# Patient Record
Sex: Female | Born: 1965 | Race: Black or African American | Hispanic: No | Marital: Single | Smoking: Never smoker
Health system: Southern US, Community
[De-identification: ages and names within clinical notes are randomized; demographics above are authoritative.]

## PROBLEM LIST (undated history)

## (undated) DIAGNOSIS — E079 Disorder of thyroid, unspecified: Secondary | ICD-10-CM

## (undated) DIAGNOSIS — E0500 Thyrotoxicosis with diffuse goiter without thyrotoxic crisis or storm: Secondary | ICD-10-CM

## (undated) DIAGNOSIS — H5789 Other specified disorders of eye and adnexa: Secondary | ICD-10-CM

## (undated) HISTORY — PX: ABDOMINAL HYSTERECTOMY: SHX81

## (undated) HISTORY — PX: COLONOSCOPY W/ BIOPSIES AND POLYPECTOMY: SHX1376

---

## 2019-11-02 DIAGNOSIS — E039 Hypothyroidism, unspecified: Secondary | ICD-10-CM | POA: Diagnosis not present

## 2019-11-02 DIAGNOSIS — Z20828 Contact with and (suspected) exposure to other viral communicable diseases: Secondary | ICD-10-CM | POA: Diagnosis not present

## 2019-11-02 DIAGNOSIS — R197 Diarrhea, unspecified: Secondary | ICD-10-CM | POA: Diagnosis not present

## 2019-11-02 DIAGNOSIS — E785 Hyperlipidemia, unspecified: Secondary | ICD-10-CM | POA: Diagnosis not present

## 2019-11-27 DIAGNOSIS — R197 Diarrhea, unspecified: Secondary | ICD-10-CM | POA: Diagnosis not present

## 2019-11-27 DIAGNOSIS — E039 Hypothyroidism, unspecified: Secondary | ICD-10-CM | POA: Diagnosis not present

## 2019-11-27 DIAGNOSIS — Z6829 Body mass index (BMI) 29.0-29.9, adult: Secondary | ICD-10-CM | POA: Diagnosis not present

## 2019-11-27 DIAGNOSIS — F4323 Adjustment disorder with mixed anxiety and depressed mood: Secondary | ICD-10-CM | POA: Diagnosis not present

## 2019-12-11 DIAGNOSIS — Z6831 Body mass index (BMI) 31.0-31.9, adult: Secondary | ICD-10-CM | POA: Diagnosis not present

## 2019-12-11 DIAGNOSIS — F4323 Adjustment disorder with mixed anxiety and depressed mood: Secondary | ICD-10-CM | POA: Diagnosis not present

## 2019-12-11 DIAGNOSIS — E039 Hypothyroidism, unspecified: Secondary | ICD-10-CM | POA: Diagnosis not present

## 2020-05-20 DIAGNOSIS — E0500 Thyrotoxicosis with diffuse goiter without thyrotoxic crisis or storm: Secondary | ICD-10-CM | POA: Diagnosis not present

## 2020-05-20 DIAGNOSIS — E669 Obesity, unspecified: Secondary | ICD-10-CM | POA: Diagnosis not present

## 2020-05-20 DIAGNOSIS — E890 Postprocedural hypothyroidism: Secondary | ICD-10-CM | POA: Diagnosis not present

## 2020-07-20 DIAGNOSIS — E890 Postprocedural hypothyroidism: Secondary | ICD-10-CM | POA: Diagnosis not present

## 2020-08-04 ENCOUNTER — Other Ambulatory Visit: Payer: Self-pay | Admitting: Obstetrics and Gynecology

## 2020-08-04 DIAGNOSIS — Z1231 Encounter for screening mammogram for malignant neoplasm of breast: Secondary | ICD-10-CM

## 2020-08-04 DIAGNOSIS — N644 Mastodynia: Secondary | ICD-10-CM | POA: Diagnosis not present

## 2020-09-10 DIAGNOSIS — E890 Postprocedural hypothyroidism: Secondary | ICD-10-CM | POA: Diagnosis not present

## 2020-09-15 ENCOUNTER — Other Ambulatory Visit: Payer: Self-pay

## 2020-09-15 ENCOUNTER — Ambulatory Visit
Admission: EM | Admit: 2020-09-15 | Discharge: 2020-09-15 | Disposition: A | Discharge status: Home / Self Care | Payer: Self-pay | Attending: Internal Medicine | Admitting: Internal Medicine

## 2020-09-15 ENCOUNTER — Encounter: Payer: Self-pay | Admitting: Emergency Medicine

## 2020-09-15 DIAGNOSIS — R6889 Other general symptoms and signs: Secondary | ICD-10-CM

## 2020-09-15 HISTORY — DX: Disorder of thyroid, unspecified: E07.9

## 2020-09-15 MED ORDER — IBUPROFEN 600 MG PO TABS: 600.0000 mg | ORAL_TABLET | Freq: Four times a day (QID) | ORAL | 0 refills | Status: AC | PRN

## 2020-09-15 MED ORDER — BENZONATATE 100 MG PO CAPS: 100.0000 mg | ORAL_CAPSULE | Freq: Three times a day (TID) | ORAL | 0 refills | Status: AC

## 2020-09-15 NOTE — ED Triage Notes (Signed)
Pt sts woke up this am with no taste and some diarrhea and body aches; pt sts has had recent covid exposure

## 2020-09-15 NOTE — Discharge Instructions (Addendum)
Per CDC recommendations there is no need to quarantine because you have had full COVID-vaccine series with the booster Wear mask when around people We will call you with lab results when available Take medications as directed. By biotin over-the-counter to help with oral dryness

## 2020-09-16 ENCOUNTER — Other Ambulatory Visit: Payer: Self-pay

## 2020-09-17 NOTE — ED Provider Notes (Signed)
[facility]    CSN: [id number] Arrival date & time: 09/15/20  1821      History   Chief Complaint Chief Complaint  Patient presents with  . Covid Exposure    HPI Martha Dixon is a 55 y.o. female to the urgent care with complaints of loss of taste, generalized body aches and diarrhea at this morning.  Patient had COVID-19 exposure few days ago.  No fever or chills.  Patient also complains of nonproductive cough.  No shortness of breath or wheezing.  Oral intake is preserved.  Bowel movements are loose with no blood or mucus.  No abdominal pain.   HPI  Past Medical History:  Diagnosis Date  . Thyroid disease     There are no problems to display for this patient.   History reviewed. No pertinent surgical history.  OB History   No obstetric history on file.      Home Medications    Prior to Admission medications   Medication Sig Start Date End Date Taking? Authorizing Provider  benzonatate (TESSALON) 100 MG capsule Take 1 capsule (100 mg total) by mouth every 8 (eight) hours. 09/15/20  Yes Leigh Kaeding, Britta Mccreedy, MD  ibuprofen (ADVIL) 600 MG tablet Take 1 tablet (600 mg total) by mouth every 6 (six) hours as needed. 09/15/20  Yes Iyonnah Ferrante, Britta Mccreedy, MD    Family History History reviewed. No pertinent family history.  Social History Social History   Tobacco Use  . Smoking status: Never Smoker  . Smokeless tobacco: Never Used  Substance Use Topics  . Alcohol use: Not Currently  . Drug use: Never     Allergies   Tetracyclines & related   Review of Systems Review of Systems  Constitutional: Negative for chills, fatigue and fever.  HENT: Negative for congestion and sore throat.   Respiratory: Positive for cough.   Gastrointestinal: Positive for diarrhea. Negative for abdominal pain, nausea and vomiting.  Genitourinary: Negative.   Musculoskeletal: Positive for myalgias.  Neurological: Negative.  Negative for headaches.     Physical  Exam Triage Vital Signs ED Triage Vitals  Enc Vitals Group     BP 09/15/20 2006 129/66     Pulse Rate 09/15/20 2006 (!) 57     Resp 09/15/20 2006 18     Temp 09/15/20 2006 98.2 F (36.8 C)     Temp src --      SpO2 09/15/20 2006 98 %     Weight --      Height --      Head Circumference --      Peak Flow --      Pain Score 09/15/20 2011 3     Pain Loc --      Pain Edu? --      Excl. in GC? --    No data found.  Updated Vital Signs BP 129/66   Pulse (!) 57   Temp 98.2 F (36.8 C)   Resp 18   SpO2 98%   Visual Acuity Right Eye Distance:   Left Eye Distance:   Bilateral Distance:    Right Eye Near:   Left Eye Near:    Bilateral Near:     Physical Exam Vitals and nursing note reviewed.  Constitutional:      General: She is not in acute distress.    Appearance: She is not ill-appearing.  Cardiovascular:     Rate and Rhythm: Normal rate and regular rhythm.     Pulses: Normal  pulses.     Heart sounds: Normal heart sounds.  Pulmonary:     Effort: Pulmonary effort is normal.     Breath sounds: Normal breath sounds.  Musculoskeletal:        General: Normal range of motion.  Neurological:     Mental Status: She is alert.      UC Treatments / Results  Labs (all labs ordered are listed, but only abnormal results are displayed) Labs Reviewed  NOVEL CORONAVIRUS, NAA    EKG   Radiology No results found.  Procedures Procedures (including critical care time)  Medications Ordered in UC Medications - No data to display  Initial Impression / Assessment and Plan / UC Course  I have reviewed the triage vital signs and the nursing notes.  Pertinent labs & imaging results that were available during my care of the patient were reviewed by me and considered in my medical decision making (see chart for details).     1.  Viral syndrome in the setting of COVID-19 exposure: COVID-19 PCR test has been sent NSAIDs as needed for pain Tessalon Perles for  cough Patient is advised to increase fluid intake Biotin for mouth dryness. Final Clinical Impressions(s) / UC Diagnoses   Final diagnoses:  Flu-like symptoms     Discharge Instructions     Per CDC recommendations there is no need to quarantine because you have had full COVID-vaccine series with the booster Wear mask when around people We will call you with lab results when available Take medications as directed. By biotin over-the-counter to help with oral dryness   ED Prescriptions    Medication Sig Dispense Auth. Provider   ibuprofen (ADVIL) 600 MG tablet Take 1 tablet (600 mg total) by mouth every 6 (six) hours as needed. 30 tablet Meriem Lemieux, Britta Mccreedy, MD   benzonatate (TESSALON) 100 MG capsule Take 1 capsule (100 mg total) by mouth every 8 (eight) hours. 21 capsule Marli Diego, Britta Mccreedy, MD     PDMP not reviewed this encounter.   Merrilee Jansky, MD 09/17/20 1104

## 2020-09-18 LAB — NOVEL CORONAVIRUS, NAA: SARS-CoV-2, NAA: NOT DETECTED

## 2020-09-22 ENCOUNTER — Ambulatory Visit: Payer: Self-pay

## 2020-11-02 ENCOUNTER — Ambulatory Visit: Payer: Self-pay

## 2020-11-13 DIAGNOSIS — E669 Obesity, unspecified: Secondary | ICD-10-CM | POA: Diagnosis not present

## 2020-11-13 DIAGNOSIS — R634 Abnormal weight loss: Secondary | ICD-10-CM | POA: Diagnosis not present

## 2020-11-13 DIAGNOSIS — R231 Pallor: Secondary | ICD-10-CM | POA: Diagnosis not present

## 2020-11-13 DIAGNOSIS — E890 Postprocedural hypothyroidism: Secondary | ICD-10-CM | POA: Diagnosis not present

## 2020-11-13 DIAGNOSIS — E0500 Thyrotoxicosis with diffuse goiter without thyrotoxic crisis or storm: Secondary | ICD-10-CM | POA: Diagnosis not present

## 2020-11-24 DIAGNOSIS — Z01419 Encounter for gynecological examination (general) (routine) without abnormal findings: Secondary | ICD-10-CM | POA: Diagnosis not present

## 2020-11-26 DIAGNOSIS — R634 Abnormal weight loss: Secondary | ICD-10-CM | POA: Diagnosis not present

## 2020-11-26 DIAGNOSIS — R531 Weakness: Secondary | ICD-10-CM | POA: Diagnosis not present

## 2020-11-26 DIAGNOSIS — R110 Nausea: Secondary | ICD-10-CM | POA: Diagnosis not present

## 2020-11-26 DIAGNOSIS — E890 Postprocedural hypothyroidism: Secondary | ICD-10-CM | POA: Diagnosis not present

## 2020-11-26 DIAGNOSIS — R231 Pallor: Secondary | ICD-10-CM | POA: Diagnosis not present

## 2020-12-01 DIAGNOSIS — R634 Abnormal weight loss: Secondary | ICD-10-CM | POA: Diagnosis not present

## 2020-12-07 ENCOUNTER — Other Ambulatory Visit: Payer: Self-pay | Admitting: Obstetrics and Gynecology

## 2020-12-07 DIAGNOSIS — Z1231 Encounter for screening mammogram for malignant neoplasm of breast: Secondary | ICD-10-CM

## 2021-01-05 ENCOUNTER — Other Ambulatory Visit: Payer: Self-pay

## 2021-01-05 ENCOUNTER — Ambulatory Visit
Admission: RE | Admit: 2021-01-05 | Discharge: 2021-01-05 | Disposition: A | Discharge status: Home / Self Care | Payer: BC Managed Care – PPO | Source: Ambulatory Visit | Attending: Obstetrics and Gynecology | Admitting: Obstetrics and Gynecology

## 2021-01-05 DIAGNOSIS — Z1231 Encounter for screening mammogram for malignant neoplasm of breast: Secondary | ICD-10-CM | POA: Diagnosis not present

## 2021-02-05 DIAGNOSIS — E890 Postprocedural hypothyroidism: Secondary | ICD-10-CM | POA: Diagnosis not present

## 2021-02-05 DIAGNOSIS — E0500 Thyrotoxicosis with diffuse goiter without thyrotoxic crisis or storm: Secondary | ICD-10-CM | POA: Diagnosis not present

## 2021-02-05 DIAGNOSIS — H8110 Benign paroxysmal vertigo, unspecified ear: Secondary | ICD-10-CM | POA: Diagnosis not present

## 2021-02-22 DIAGNOSIS — R42 Dizziness and giddiness: Secondary | ICD-10-CM | POA: Diagnosis not present

## 2021-02-22 DIAGNOSIS — R2681 Unsteadiness on feet: Secondary | ICD-10-CM | POA: Diagnosis not present

## 2021-02-22 DIAGNOSIS — H8110 Benign paroxysmal vertigo, unspecified ear: Secondary | ICD-10-CM | POA: Diagnosis not present

## 2021-03-01 DIAGNOSIS — Z131 Encounter for screening for diabetes mellitus: Secondary | ICD-10-CM | POA: Diagnosis not present

## 2021-03-01 DIAGNOSIS — E0500 Thyrotoxicosis with diffuse goiter without thyrotoxic crisis or storm: Secondary | ICD-10-CM | POA: Diagnosis not present

## 2021-03-01 DIAGNOSIS — R5383 Other fatigue: Secondary | ICD-10-CM | POA: Diagnosis not present

## 2021-03-01 DIAGNOSIS — E890 Postprocedural hypothyroidism: Secondary | ICD-10-CM | POA: Diagnosis not present

## 2021-03-02 DIAGNOSIS — R7309 Other abnormal glucose: Secondary | ICD-10-CM | POA: Diagnosis not present

## 2022-02-04 DIAGNOSIS — E890 Postprocedural hypothyroidism: Secondary | ICD-10-CM | POA: Diagnosis not present

## 2022-02-04 DIAGNOSIS — E0500 Thyrotoxicosis with diffuse goiter without thyrotoxic crisis or storm: Secondary | ICD-10-CM | POA: Diagnosis not present

## 2022-03-03 IMAGING — MG MM DIGITAL SCREENING BILAT W/ TOMO AND CAD
8 series · 8 of 24 positions shown · non-contrast
Comparison: Previous exam(s).

CLINICAL DATA: Screening.

EXAM:
DIGITAL SCREENING BILATERAL MAMMOGRAM WITH TOMOSYNTHESIS AND CAD
TECHNIQUE: Bilateral screening digital craniocaudal and mediolateral oblique
mammograms were obtained. Bilateral screening digital breast
tomosynthesis was performed. The images were evaluated with
computer-aided detection.

[L CC synth-2D]
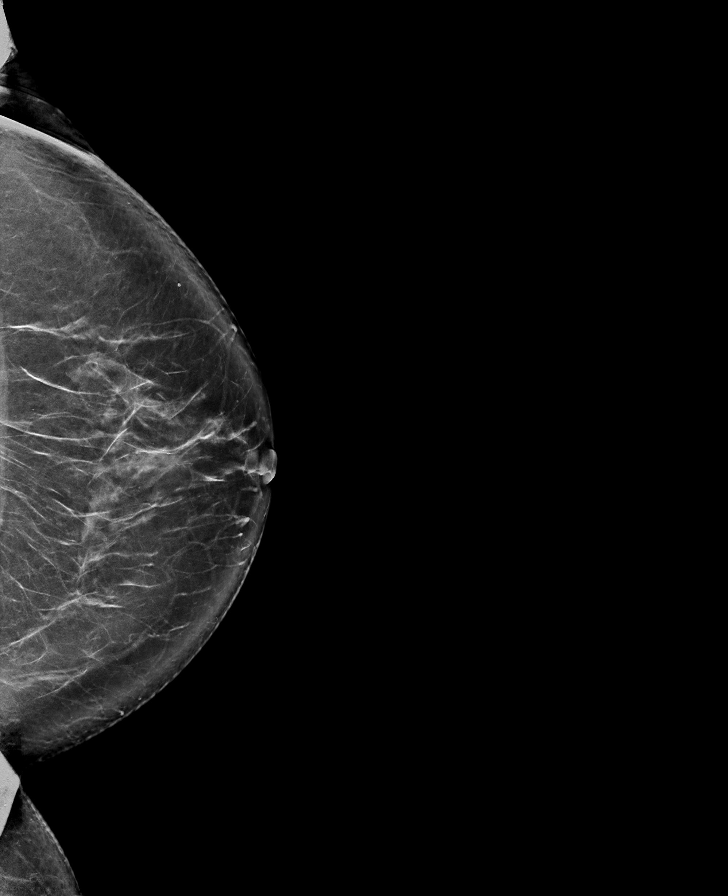

[R MLO synth-2D]
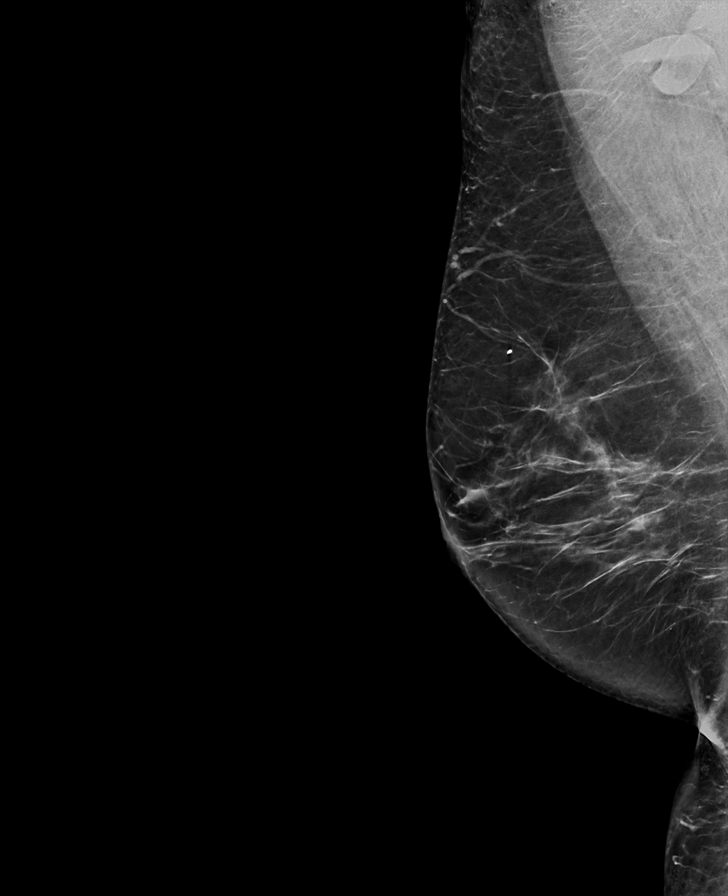

[R CC synth-2D]
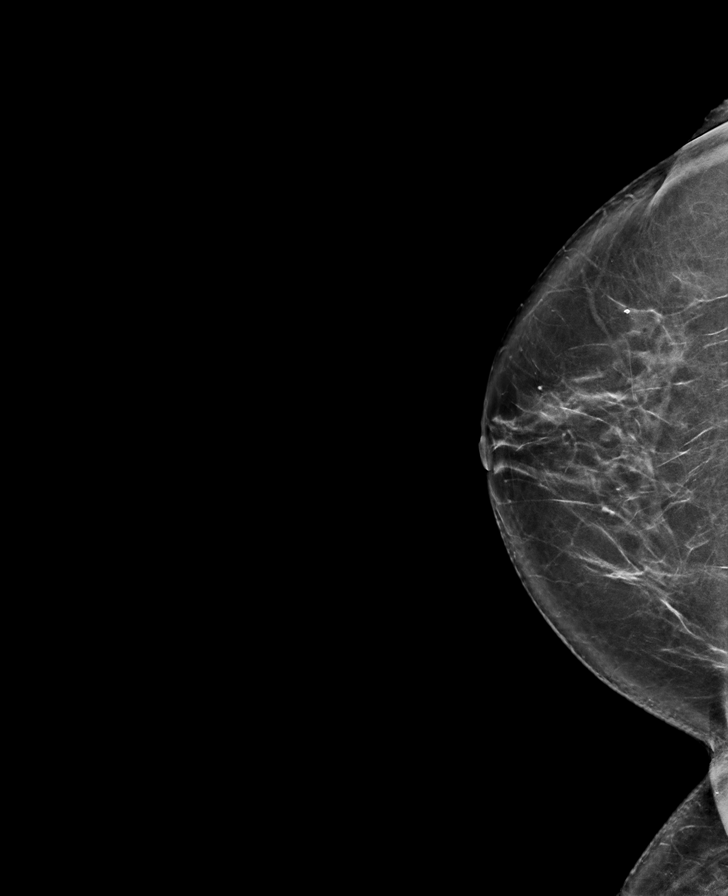

[L MLO synth-2D]
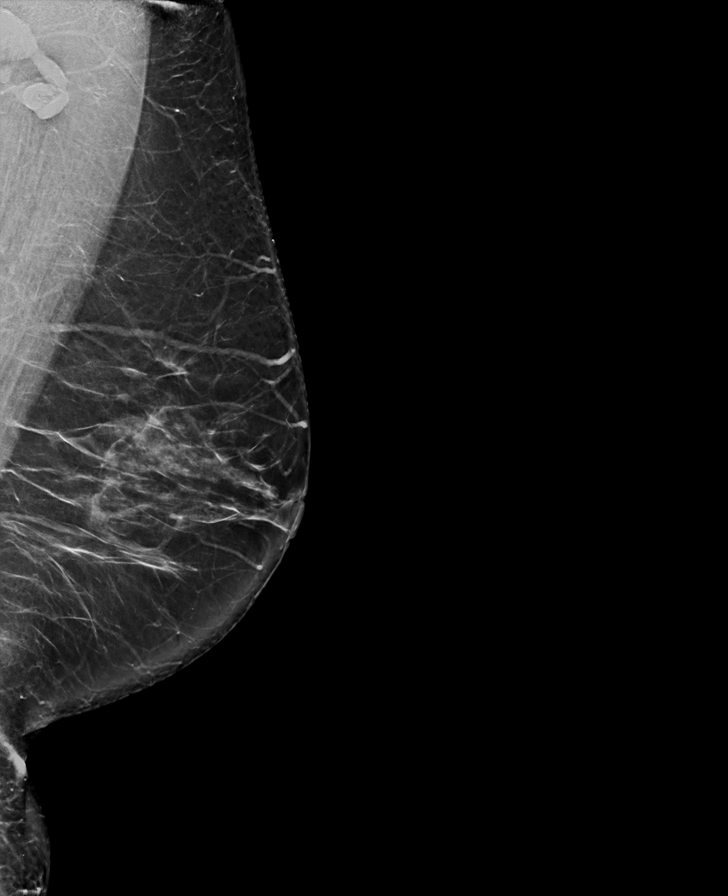

[L MLO tomo · tomo slice 43/86.0]
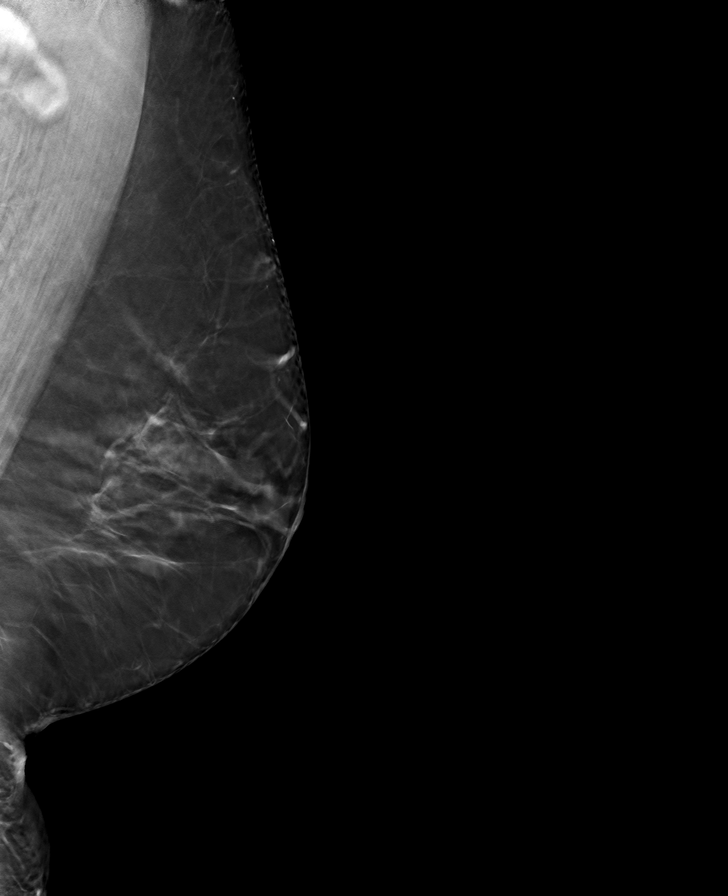

[R CC tomo · tomo slice 41/82.0]
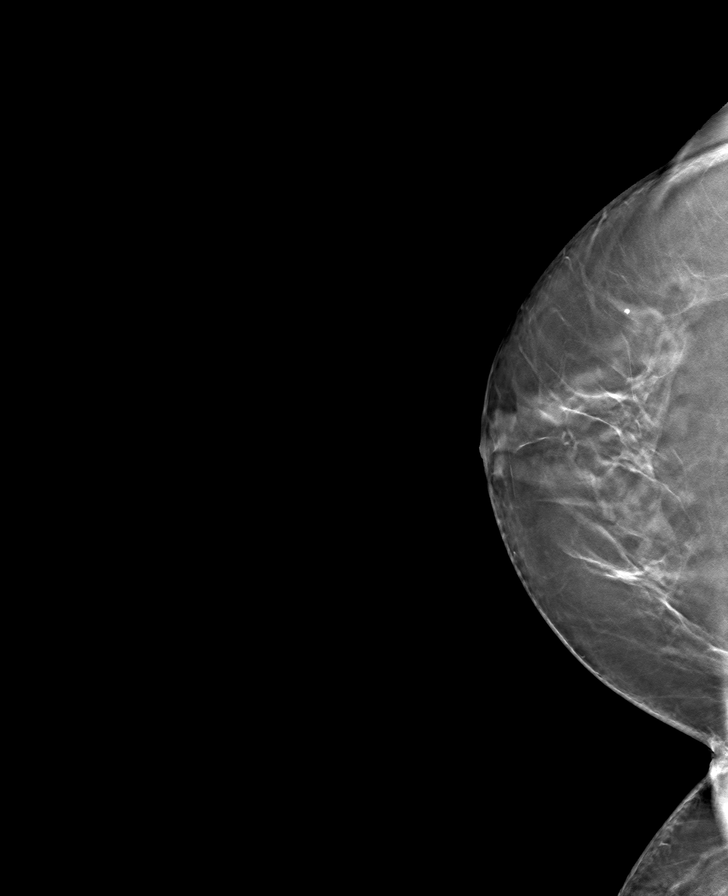

[L CC tomo · tomo slice 44/87.0]
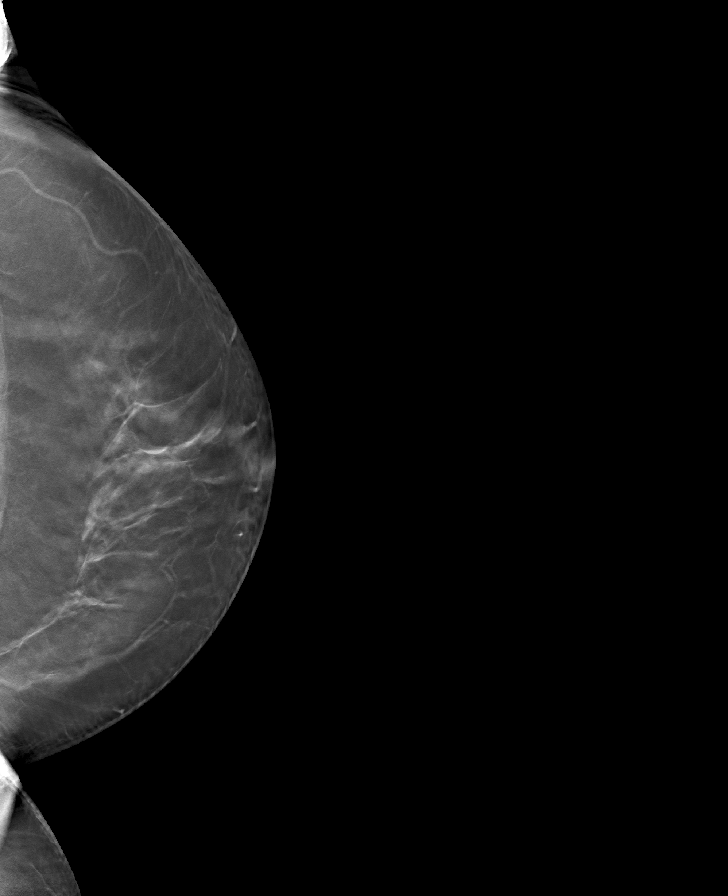

[R MLO tomo · tomo slice 39/78.0]
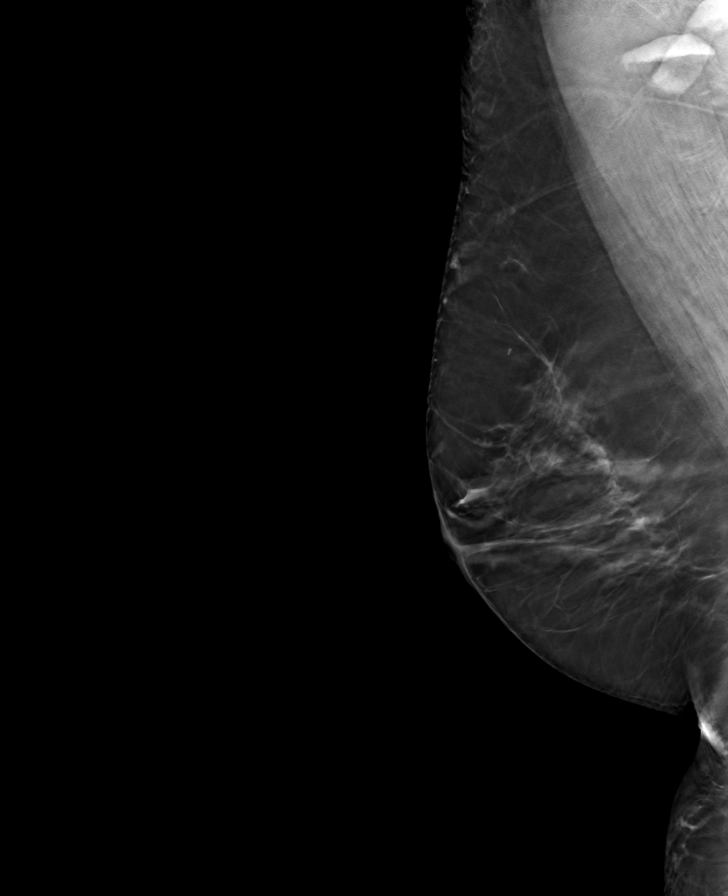

[8 of 24 positions shown; findings below may reference images not displayed]

ACR Breast Density Category b: There are scattered areas of
fibroglandular density.
FINDINGS: There are no findings suspicious for malignancy. The images were
evaluated with computer-aided detection.
IMPRESSION: No mammographic evidence of malignancy. A result letter of this
screening mammogram will be mailed directly to the patient.

RECOMMENDATION:
Screening mammogram in one year. (Code:[id number])

BI-RADS CATEGORY  1: Negative.

## 2022-03-21 ENCOUNTER — Ambulatory Visit
Admission: RE | Admit: 2022-03-21 | Discharge: 2022-03-21 | Disposition: A | Discharge status: Home / Self Care | Payer: BC Managed Care – PPO | Source: Ambulatory Visit | Attending: Internal Medicine | Admitting: Internal Medicine

## 2022-03-21 ENCOUNTER — Other Ambulatory Visit: Payer: Self-pay | Admitting: Internal Medicine

## 2022-03-21 DIAGNOSIS — M7051 Other bursitis of knee, right knee: Secondary | ICD-10-CM | POA: Diagnosis not present

## 2022-03-21 DIAGNOSIS — M1711 Unilateral primary osteoarthritis, right knee: Secondary | ICD-10-CM | POA: Diagnosis not present

## 2022-04-08 DIAGNOSIS — M7051 Other bursitis of knee, right knee: Secondary | ICD-10-CM | POA: Diagnosis not present

## 2022-04-15 DIAGNOSIS — R030 Elevated blood-pressure reading, without diagnosis of hypertension: Secondary | ICD-10-CM | POA: Diagnosis not present

## 2022-04-15 DIAGNOSIS — E890 Postprocedural hypothyroidism: Secondary | ICD-10-CM | POA: Diagnosis not present

## 2022-04-15 DIAGNOSIS — E669 Obesity, unspecified: Secondary | ICD-10-CM | POA: Diagnosis not present

## 2022-05-11 DIAGNOSIS — Z01419 Encounter for gynecological examination (general) (routine) without abnormal findings: Secondary | ICD-10-CM | POA: Diagnosis not present

## 2022-05-20 ENCOUNTER — Other Ambulatory Visit: Payer: Self-pay | Admitting: Obstetrics and Gynecology

## 2022-05-20 DIAGNOSIS — Z1231 Encounter for screening mammogram for malignant neoplasm of breast: Secondary | ICD-10-CM

## 2022-05-26 ENCOUNTER — Ambulatory Visit
Admission: RE | Admit: 2022-05-26 | Discharge: 2022-05-26 | Disposition: A | Discharge status: Home / Self Care | Payer: BC Managed Care – PPO | Source: Ambulatory Visit | Attending: Obstetrics and Gynecology | Admitting: Obstetrics and Gynecology

## 2022-05-26 DIAGNOSIS — Z1231 Encounter for screening mammogram for malignant neoplasm of breast: Secondary | ICD-10-CM

## 2022-05-26 DIAGNOSIS — H0279 Other degenerative disorders of eyelid and periocular area: Secondary | ICD-10-CM | POA: Diagnosis not present

## 2022-05-26 DIAGNOSIS — H05243 Constant exophthalmos, bilateral: Secondary | ICD-10-CM | POA: Diagnosis not present

## 2022-05-26 DIAGNOSIS — E0500 Thyrotoxicosis with diffuse goiter without thyrotoxic crisis or storm: Secondary | ICD-10-CM | POA: Diagnosis not present

## 2022-08-03 DIAGNOSIS — M25561 Pain in right knee: Secondary | ICD-10-CM | POA: Diagnosis not present

## 2022-10-14 DIAGNOSIS — I10 Essential (primary) hypertension: Secondary | ICD-10-CM | POA: Diagnosis not present

## 2022-10-14 DIAGNOSIS — Z23 Encounter for immunization: Secondary | ICD-10-CM | POA: Diagnosis not present

## 2022-10-14 DIAGNOSIS — E669 Obesity, unspecified: Secondary | ICD-10-CM | POA: Diagnosis not present

## 2022-11-15 DIAGNOSIS — R0789 Other chest pain: Secondary | ICD-10-CM | POA: Diagnosis not present

## 2022-11-15 DIAGNOSIS — Z8719 Personal history of other diseases of the digestive system: Secondary | ICD-10-CM | POA: Diagnosis not present

## 2022-11-15 DIAGNOSIS — R079 Chest pain, unspecified: Secondary | ICD-10-CM | POA: Diagnosis not present

## 2023-02-09 DIAGNOSIS — E669 Obesity, unspecified: Secondary | ICD-10-CM | POA: Diagnosis not present

## 2023-02-09 DIAGNOSIS — E0500 Thyrotoxicosis with diffuse goiter without thyrotoxic crisis or storm: Secondary | ICD-10-CM | POA: Diagnosis not present

## 2023-02-09 DIAGNOSIS — E890 Postprocedural hypothyroidism: Secondary | ICD-10-CM | POA: Diagnosis not present

## 2023-04-18 ENCOUNTER — Other Ambulatory Visit (HOSPITAL_BASED_OUTPATIENT_CLINIC_OR_DEPARTMENT_OTHER): Payer: Self-pay | Admitting: Internal Medicine

## 2023-04-18 ENCOUNTER — Ambulatory Visit (HOSPITAL_BASED_OUTPATIENT_CLINIC_OR_DEPARTMENT_OTHER)
Admission: RE | Admit: 2023-04-18 | Discharge: 2023-04-18 | Disposition: A | Discharge status: Home / Self Care | Payer: Self-pay | Source: Ambulatory Visit | Attending: Internal Medicine | Admitting: Internal Medicine

## 2023-04-18 DIAGNOSIS — R519 Headache, unspecified: Secondary | ICD-10-CM | POA: Insufficient documentation

## 2023-06-14 DIAGNOSIS — D649 Anemia, unspecified: Secondary | ICD-10-CM | POA: Diagnosis not present

## 2023-06-21 DIAGNOSIS — N183 Chronic kidney disease, stage 3 unspecified: Secondary | ICD-10-CM | POA: Diagnosis not present

## 2023-07-07 DIAGNOSIS — D128 Benign neoplasm of rectum: Secondary | ICD-10-CM | POA: Diagnosis not present

## 2023-07-07 DIAGNOSIS — R195 Other fecal abnormalities: Secondary | ICD-10-CM | POA: Diagnosis not present

## 2023-07-07 DIAGNOSIS — K5730 Diverticulosis of large intestine without perforation or abscess without bleeding: Secondary | ICD-10-CM | POA: Diagnosis not present

## 2023-07-07 DIAGNOSIS — K635 Polyp of colon: Secondary | ICD-10-CM | POA: Diagnosis not present

## 2023-08-21 DIAGNOSIS — D120 Benign neoplasm of cecum: Secondary | ICD-10-CM | POA: Diagnosis not present

## 2023-08-21 DIAGNOSIS — K635 Polyp of colon: Secondary | ICD-10-CM | POA: Diagnosis not present

## 2023-08-21 DIAGNOSIS — Z860100 Personal history of colon polyps, unspecified: Secondary | ICD-10-CM | POA: Diagnosis not present

## 2023-08-21 DIAGNOSIS — R195 Other fecal abnormalities: Secondary | ICD-10-CM | POA: Diagnosis not present

## 2023-08-21 DIAGNOSIS — Z9889 Other specified postprocedural states: Secondary | ICD-10-CM | POA: Diagnosis not present

## 2023-08-22 DIAGNOSIS — Z860100 Personal history of colon polyps, unspecified: Secondary | ICD-10-CM | POA: Diagnosis not present

## 2023-08-22 DIAGNOSIS — D120 Benign neoplasm of cecum: Secondary | ICD-10-CM | POA: Diagnosis not present

## 2023-08-22 DIAGNOSIS — K635 Polyp of colon: Secondary | ICD-10-CM | POA: Diagnosis not present

## 2023-08-22 DIAGNOSIS — Z9889 Other specified postprocedural states: Secondary | ICD-10-CM | POA: Diagnosis not present

## 2023-10-02 DIAGNOSIS — Z1389 Encounter for screening for other disorder: Secondary | ICD-10-CM | POA: Diagnosis not present

## 2023-10-02 DIAGNOSIS — I10 Essential (primary) hypertension: Secondary | ICD-10-CM | POA: Diagnosis not present

## 2023-10-02 DIAGNOSIS — E890 Postprocedural hypothyroidism: Secondary | ICD-10-CM | POA: Diagnosis not present

## 2024-03-22 ENCOUNTER — Ambulatory Visit
Admission: RE | Admit: 2024-03-22 | Discharge: 2024-03-22 | Disposition: A | Discharge status: Home / Self Care | Source: Ambulatory Visit

## 2024-03-22 ENCOUNTER — Other Ambulatory Visit: Payer: Self-pay

## 2024-03-22 VITALS — BP 127/84 | HR 77 | Temp 98.3°F | Resp 16

## 2024-03-22 DIAGNOSIS — H00015 Hordeolum externum left lower eyelid: Secondary | ICD-10-CM | POA: Diagnosis not present

## 2024-03-22 HISTORY — DX: Thyrotoxicosis with diffuse goiter without thyrotoxic crisis or storm: E05.00

## 2024-03-22 HISTORY — DX: Other specified disorders of eye and adnexa: H57.89

## 2024-03-22 MED ORDER — ERYTHROMYCIN 5 MG/GM OP OINT: TOPICAL_OINTMENT | OPHTHALMIC | 0 refills | Status: AC

## 2024-03-22 NOTE — Discharge Instructions (Addendum)
 [name]

## 2024-03-22 NOTE — ED Provider Notes (Signed)
 [facility]    CSN: [id number] Arrival date & time: 03/22/24  1143      History   Chief Complaint Chief Complaint  Patient presents with   Eye Problem    HPI Martha Dixon is a 58 y.o. female.  Patient here today for evaluation of a small painful, raised bump located mid upper lower eyelid, left.  Patient is concerned that it is a stye.  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  Not experiencing any visual acuity changes.  She also recently purchased some mascara that she typically does not use and is concerned that  may have contributed to stye developing.  Has not experienced any drainage from left eye. Past Medical History:  Diagnosis Date   Graves' disease    Thyroid  disease    Thyroid  eye disease     There are no active problems to display for this patient.   Past Surgical History:  Procedure Laterality Date   ABDOMINAL HYSTERECTOMY     COLONOSCOPY W/ BIOPSIES AND POLYPECTOMY      OB History   No obstetric history on file.      Home Medications    Prior to Admission medications   Medication Sig Start Date End Date Taking? Authorizing Provider  erythromycin ophthalmic ointment Place a 1/2 inch ribbon of ointment into the lower eyelid twice daily (every 12 hours) for 7 days. 03/22/24  Yes Arloa Suzen RAMAN, NP  levothyroxine (SYNTHROID) 100 MCG tablet Take 100 mcg by mouth daily before breakfast.   Yes [provider]  benzonatate  (TESSALON ) 100 MG capsule Take 1 capsule (100 mg total) by mouth every 8 (eight) hours. 09/15/20   LampteyAleene KIDD, MD  ibuprofen  (ADVIL ) 600 MG tablet Take 1 tablet (600 mg total) by mouth every 6 (six) hours as needed. 09/15/20   Lamptey, Aleene KIDD, MD    Family History History reviewed. No pertinent family history.  Social History Social History   Tobacco Use   Smoking status: Former    Types: Cigarettes    Smokeless tobacco: Never  Substance Use Topics   Alcohol use: Not Currently   Drug use: Never     Allergies   Naltrexone-bupropion hcl er and Tetracyclines & related   Review of Systems Review of Systems   Physical Exam Triage Vital Signs ED Triage Vitals  Encounter Vitals Group     BP 03/22/24 1203 127/84     Girls Systolic BP Percentile --      Girls Diastolic BP Percentile --      Boys Systolic BP Percentile --      Boys Diastolic BP Percentile --      Pulse Rate 03/22/24 1203 77     Resp 03/22/24 1203 16     Temp 03/22/24 1203 98.3 F (36.8 C)     Temp Source 03/22/24 1203 Oral     SpO2 03/22/24 1203 95 %     Weight --      Height --      Head Circumference --      Peak Flow --      Pain Score 03/22/24 1157 0     Pain Loc --      Pain Education --      Exclude from Growth Chart --    No data found.  Updated Vital Signs BP 127/84 (BP Location: Left Arm)   Pulse  77   Temp 98.3 F (36.8 C) (Oral)   Resp 16   SpO2 95%   Visual Acuity Right Eye Distance:   Left Eye Distance:   Bilateral Distance:    Right Eye Near:   Left Eye Near:    Bilateral Near:     Physical Exam Vitals reviewed.  HENT:     Head: Normocephalic and atraumatic.     Nose: Nose normal.  Eyes:     General:        Right eye: No foreign body, discharge or hordeolum.        Left eye: Hordeolum present.No discharge.     Extraocular Movements: Extraocular movements intact.     Pupils: Pupils are equal, round, and reactive to light.     Comments: Left lower eyelid swelling  Cardiovascular:     Rate and Rhythm: Normal rate and regular rhythm.  Pulmonary:     Effort: Pulmonary effort is normal.     Breath sounds: Normal breath sounds and air entry.  Musculoskeletal:     Cervical back: Normal range of motion.  Skin:    General: Skin is warm.  Neurological:     General: No focal deficit present.     Mental Status: She is alert.      UC Treatments / Results  Labs (all labs  ordered are listed, but only abnormal results are displayed) Labs Reviewed - No data to display  EKG   Radiology No results found.  Procedures Procedures (including critical care time)  Medications Ordered in UC Medications - No data to display  Initial Impression / Assessment and Plan / UC Course  I have reviewed the triage vital signs and the nursing notes.  Pertinent labs & imaging results that were available during my care of the patient were reviewed by me and considered in my medical decision making (see chart for details).    Left lower eyelid, stye, given status causing pain and lower eyelid swelling Will cover with Romycin 1 being applied to lower eyelid twice daily for  7 days.  Continue warm compresses. Return if symptoms worsen or do not improve with treatment. Final diagnoses:  Hordeolum externum of left lower eyelid     Discharge Instructions           ED Prescriptions     Medication Sig Dispense Auth. Provider   erythromycin ophthalmic ointment Place a 1/2 inch ribbon of ointment into the lower eyelid twice daily (every 12 hours) for 7 days. 3.5 g Arloa Suzen RAMAN, NP      PDMP not reviewed this encounter.   Arloa Suzen RAMAN, NP 03/22/24 1254

## 2024-03-22 NOTE — ED Triage Notes (Signed)
 Bump on bottom eye lid Stye or bite possible - Entered by patient  States she woke up with painful bump on left lower lid. Did warm compresses before arrival
